# Patient Record
Sex: Female | Born: 1970 | Race: White | Smoking: Current every day smoker
Health system: Southern US, Community
[De-identification: ages and names within clinical notes are randomized; demographics above are authoritative.]

## PROBLEM LIST (undated history)

## (undated) DIAGNOSIS — J45909 Unspecified asthma, uncomplicated: Secondary | ICD-10-CM

## (undated) DIAGNOSIS — J449 Chronic obstructive pulmonary disease, unspecified: Secondary | ICD-10-CM

## (undated) DIAGNOSIS — K8020 Calculus of gallbladder without cholecystitis without obstruction: Secondary | ICD-10-CM

## (undated) DIAGNOSIS — D649 Anemia, unspecified: Secondary | ICD-10-CM

## (undated) HISTORY — DX: Chronic obstructive pulmonary disease, unspecified: J44.9

## (undated) HISTORY — DX: Anemia, unspecified: D64.9

## (undated) HISTORY — DX: Calculus of gallbladder without cholecystitis without obstruction: K80.20

## (undated) HISTORY — PX: TONSILLECTOMY: SUR1361

## (undated) HISTORY — DX: Unspecified asthma, uncomplicated: J45.909

---

## 2016-03-08 ENCOUNTER — Institutional Professional Consult (permissible substitution): Payer: Self-pay | Admitting: Internal Medicine

## 2016-03-23 ENCOUNTER — Ambulatory Visit (INDEPENDENT_AMBULATORY_CARE_PROVIDER_SITE_OTHER): Payer: 59 | Admitting: Internal Medicine

## 2016-03-23 ENCOUNTER — Other Ambulatory Visit: Payer: Managed Care, Other (non HMO)

## 2016-03-23 ENCOUNTER — Ambulatory Visit (INDEPENDENT_AMBULATORY_CARE_PROVIDER_SITE_OTHER)
Admission: RE | Admit: 2016-03-23 | Discharge: 2016-03-23 | Disposition: A | Discharge status: Home / Self Care | Payer: 59 | Source: Ambulatory Visit | Attending: Internal Medicine | Admitting: Internal Medicine

## 2016-03-23 ENCOUNTER — Encounter: Payer: Self-pay | Admitting: Internal Medicine

## 2016-03-23 VITALS — BP 142/86 | HR 100 | Ht 65.0 in | Wt 177.8 lb

## 2016-03-23 DIAGNOSIS — Z720 Tobacco use: Secondary | ICD-10-CM

## 2016-03-23 DIAGNOSIS — J449 Chronic obstructive pulmonary disease, unspecified: Secondary | ICD-10-CM

## 2016-03-23 DIAGNOSIS — F17210 Nicotine dependence, cigarettes, uncomplicated: Secondary | ICD-10-CM | POA: Insufficient documentation

## 2016-03-23 MED ORDER — BUDESONIDE-FORMOTEROL FUMARATE 160-4.5 MCG/ACT IN AERO: INHALATION_SPRAY | RESPIRATORY_TRACT | Status: DC

## 2016-03-23 NOTE — Assessment & Plan Note (Signed)

## 2016-03-23 NOTE — Progress Notes (Signed)
Subjective:    Patient ID: Shelly RivalMary Haynes, female    DOB: 1971/10/19,    MRN: [id number]  HPI   [age] yowf active smoker/ [facility] monitor [facility] asthma as child severe enough to require ER visits but out grew age 89 and  Fine including sports in HS but started smoking age 45 and around early 2007 dx as copd by [facility] in [place] rx advair which helped and then changed over to symbicort 80 and not doing as well so self referred to pulmonary clinic 03/23/2016    03/23/2016 1st  Pulmonary office visit/ Shelly Haynes   Chief Complaint  Patient presents with  . Pulmonary Consult    Self referral. Pt states she was dxed with COPD approx 10 yrs ago. She states she has also been dxed with allergies and asthma. She c/o when she walks briskly or runs.   feels tight in chest in am better p symbicort  MMRC1 = can walk nl pace, flat grade, can't hurry or go uphills or steps s sob   Worse with uri's / worse with exp dog or cat   No obvious other patterns in day to day or daytime variabilty or assoc excess/ purulent sputum or mucus plugs  or cp or chest tightness, subjective wheeze overt sinus or hb symptoms. No unusual exp hx or h/o childhood pna/ asthma or knowledge of premature birth.  Sleeping ok without nocturnal  or early am exacerbation  of respiratory  c/o's or need for noct saba. Also denies any obvious fluctuation of symptoms with weather or environmental changes or other aggravating or alleviating factors except as outlined above   Current Medications, Allergies, Complete Past Medical History, Past Surgical History, Family History, and Social History were reviewed in [facility] electronic medical record.                 Review of Systems  Constitutional: Negative for fever, chills and unexpected weight change.  HENT: Negative for congestion, dental problem, ear pain, nosebleeds, postnasal drip, rhinorrhea, sinus pressure, sneezing, sore throat, trouble swallowing and voice change.   Eyes:  Negative for visual disturbance.  Respiratory: Positive for shortness of breath. Negative for cough and choking.   Cardiovascular: Negative for chest pain and leg swelling.  Gastrointestinal: Negative for vomiting, abdominal pain and diarrhea.  Genitourinary: Negative for difficulty urinating.  Musculoskeletal: Negative for arthralgias.  Skin: Negative for rash.  Neurological: Negative for tremors, syncope and headaches.  Hematological: Does not bruise/bleed easily.       Objective:   Physical Exam  amb wf nad  Wt Readings from Last 3 Encounters:  03/23/16 177 lb 12.8 oz (80.65 kg)    Vital signs reviewed   HEENT: nl dentition, turbinates, and oropharynx. Nl external ear canals without cough reflex   NECK :  without JVD/Nodes/TM/ nl carotid upstrokes bilaterally   LUNGS: no acc muscle use,  Nl contour chest which is clear to A and P bilaterally without cough on insp or exp maneuvers   CV:  RRR  no s3 or murmur or increase in P2, no edema   ABD:  soft and nontender with nl inspiratory excursion in the supine position. No bruits or organomegaly, bowel sounds nl  MS:  Nl gait/ ext warm without deformities, calf tenderness, cyanosis or clubbing No obvious joint restrictions   SKIN: warm and dry without lesions    NEURO:  alert, approp, nl sensorium with  no motor deficits   CXR PA and Lateral:  03/23/2016 :    I personally reviewed images and agree with radiology impression as follows:   No active cardiopulmonary disease.       Labs ordered 03/23/2016 > alpha one phenotype, level     Assessment & Plan:

## 2016-03-23 NOTE — Patient Instructions (Addendum)
Plan A = Automatic = Symbicort 160 Take 2 puffs first thing in am and then another 2 puffs about 12 hours later.     Work on inhaler technique:  relax and gently blow all the way out then take a nice smooth deep breath back in, triggering the inhaler at same time you start breathing in.  Hold for up to 5 seconds if you can. Blow out thru nose. Rinse and gargle with water when done      Plan B = Backup Only use your albuterol as a rescue medication to be used if you can't catch your breath by resting or doing a relaxed purse lip breathing pattern.  - The less you use it, the better it will work when you need it. - Ok to use the inhaler up to 2 puffs  every 4 hours if you must but call for appointment if use goes up over your usual need - Don't leave home without it !!  (think of it like the spare tire for your car)   The key is to stop smoking completely before smoking completely stops you!    Please remember to go to the lab and x-ray department downstairs for your tests - we will call you with the results when they are available.  Please schedule a follow up office visit in 6 weeks, call sooner if needed with pfts same day - ok to push back         Plan D = Doctor - call me if B and C not adequate  Plan E = ER - go to ER or call 911 if all else fails

## 2016-03-24 NOTE — Assessment & Plan Note (Addendum)
Spirometry 03/23/2016  FEV1 2.09 (70%)  Ratio 67 p symb 80 x2 in am with 50% hfa      DDX of  difficult airways management almost all start with A and  include Adherence, Ace Inhibitors, Acid Reflux, Active Sinus Disease, Alpha 1 Antitripsin deficiency, Anxiety masquerading as Airways dz,  ABPA,  Allergy(esp in young), Aspiration (esp in elderly), Adverse effects of meds,  Active smokers, A bunch of PE's (a small clot burden can't cause this syndrome unless there is already severe underlying pulm or vascular dz with poor reserve) plus two Bs  = Bronchiectasis and Beta blocker use..and one C= CHF  Adherence is always the initial "prime suspect" and is a multilayered concern that requires a "trust but verify" approach in every patient - starting with knowing how to use medications, especially inhalers, correctly, keeping up with refills and understanding the fundamental difference between maintenance and prns vs those medications only taken for a very short course and then stopped and not refilled.  - The proper method of use, as well as anticipated side effects, of a metered-dose inhaler are discussed and demonstrated to the patient. Improved effectiveness after extensive coaching during this visit to a level of approximately 75 % from a baseline of 50 % > try symbicort 160 2bid pending return for full pfts  Active smoking also top of the list (see separate a/p)  Alpha one screening indicated > sent   ? Anxiety > dx of exclusion

## 2016-03-25 LAB — ALPHA-1-ANTITRYPSIN: A-1 Antitrypsin, Ser: 183 mg/dL (ref 83–199)

## 2016-04-01 LAB — ALPHA-1 ANTITRYPSIN PHENOTYPE: A-1 Antitrypsin: 191 mg/dL (ref 83–199)

## 2016-05-10 ENCOUNTER — Telehealth: Payer: Self-pay | Admitting: Internal Medicine

## 2016-05-10 NOTE — Telephone Encounter (Signed)
Spoke with patient-states the pharmacy called her as well; they were filling her insurance as well as the free for 1 year card given to her by our office. Once they did not file her insurance and only the free meds card it went through and no troubles at this time. Pt should not have any additional trouble getting filled for 1 year. Will close message.

## 2016-05-26 ENCOUNTER — Encounter: Payer: Self-pay | Admitting: Gastroenterology

## 2016-05-31 ENCOUNTER — Ambulatory Visit: Payer: Managed Care, Other (non HMO) | Admitting: Internal Medicine

## 2016-07-28 ENCOUNTER — Encounter (INDEPENDENT_AMBULATORY_CARE_PROVIDER_SITE_OTHER): Payer: Self-pay

## 2016-07-28 ENCOUNTER — Encounter: Payer: Self-pay | Admitting: Gastroenterology

## 2016-07-28 ENCOUNTER — Ambulatory Visit (INDEPENDENT_AMBULATORY_CARE_PROVIDER_SITE_OTHER): Payer: 59 | Admitting: Gastroenterology

## 2016-07-28 VITALS — BP 148/80 | HR 84 | Ht 65.0 in | Wt 179.0 lb

## 2016-07-28 DIAGNOSIS — K8050 Calculus of bile duct without cholangitis or cholecystitis without obstruction: Secondary | ICD-10-CM

## 2016-07-28 DIAGNOSIS — K8020 Calculus of gallbladder without cholecystitis without obstruction: Secondary | ICD-10-CM | POA: Diagnosis not present

## 2016-07-28 DIAGNOSIS — D649 Anemia, unspecified: Secondary | ICD-10-CM

## 2016-07-28 NOTE — Progress Notes (Signed)
HPI :  45 y/o female with a history of asthma / COPD, reported anemia, and gallstones, here for a new patient visit for abdominal pain / gallstones  She had an US done at [facility] in the ER. She presented there on 8/28 - pain in the RUQ. She reports she has been having some intermittent pains for a while, she thinks a year. When she eats she has some nausea, and usually develops some epigastric discomfort that radiates into her back. Fatty meals reliably precipitate her symptoms. She reports within 30-45 min of eating she will feel the pain, it will last an hour or so and then go away.   In addition to this she developed a new type of pain a few weeks ago which led to her ER visit - she thinks it was pain more so in the RUQ that radiated to her shoulders on the both sides, after she ate. She states her LFTs were normal (labs not available during clinic visit today) and RUQ US showed a positive Murphy sign with 2.6cm gallstone noted, with normal biliary tree / CBD.   She does endorse ibuprofen use, can take a few per day, a few days per week. She has taken nexium for the past 6 months for these symptoms, dosed at 40mg . Her pain has persisted during this time. No trouble swallowing or eating. No routine vomiting.   She reports she was told she had an anemia / low iron studies, which she thinks is new. She denies any blood in the stools.  No trouble with her bowels. No prior colonoscopy. No FH of colon cancer. She does endorse heavy menstrual cycles.   US done on 07/18/16 - 2.6cm gallstone noted, positive murphy sign, normal CBD and biliary tree. She wasa told she had normal liver enzymes.    Past Medical History:  Diagnosis Date  . Anemia   . Asthma   . COPD (chronic obstructive pulmonary disease) (HCC)   . Gallstones      Past Surgical History:  Procedure Laterality Date  . CESAREAN SECTION    . TONSILLECTOMY     Family History  Problem Relation Age of Onset  . Asthma  Sister   . Asthma Mother   . Asthma Maternal Grandmother   . Diabetes Father   . Heart disease Father     multiple family members on paternal side with heart disease as well  . Lung cancer Maternal Grandfather   . Lung cancer Maternal Uncle     x 2   Social History  Substance Use Topics  . Smoking status: Current Every Day Smoker    Packs/day: 1.50    Years: 26.00    Types: Cigarettes  . Smokeless tobacco: Never Used     Comment: form given 07/28/16  . Alcohol use No   Current Outpatient Prescriptions  Medication Sig Dispense Refill  . albuterol (VENTOLIN HFA) 108 (90 Base) MCG/ACT inhaler Inhale 2 puffs into the lungs every 6 (six) hours as needed for wheezing or shortness of breath.    Marland Kitchen amoxicillin (AMOXIL) 500 MG tablet Take 500 mg by mouth 2 (two) times daily.    . budesonide-formoterol (SYMBICORT) 160-4.5 MCG/ACT inhaler Take 2 puffs first thing in am and then another 2 puffs about 12 hours later. 1 Inhaler 11   No current facility-administered medications for this visit.    Allergies  Allergen Reactions  . Penicillins Rash     Review of Systems: All systems reviewed  and negative except where noted in HPI.    No results found. No labs available during today's visit  Physical Exam: BP (!) 148/80   Pulse 84   Ht 5\' 5"  (1.651 m)   Wt 179 lb (81.2 kg)   BMI 29.79 kg/m  Constitutional: Pleasant,well-developed, female in no acute distress. HEENT: Normocephalic and atraumatic. Conjunctivae are normal.  Neck supple.  Cardiovascular: Normal rate, regular rhythm.  Pulmonary/chest: Effort normal and breath sounds normal. No wheezing, rales or rhonchi. Abdominal: Soft, nondistended, nontender. Bowel sounds active throughout. There are no masses palpable. No hepatomegaly. Extremities: no edema Lymphadenopathy: No cervical adenopathy noted. Neurological: Alert and oriented to person place and time. Skin: Skin is warm and dry. No rashes noted. Psychiatric: Normal mood  and affect. Behavior is normal.   ASSESSMENT AND PLAN: 45 y/o female with intermittent postprandial pain for the past year, with a new severe type of RUQ pain leading to ER visit a few weeks ago. Symptoms appear to radiate to back / shoulder. LFTs reportedly normal (will obtain this result), US shows large gallstone without CBD dilation.   I suspect she likely has biliary colic / gallstones causing her symptoms. She has taken nexium for the past 6 months which has not helped her symptoms, I think PUD is less likely. I discussed gallstones / biliary colic with her, and potential risks of pancreatitis / biliary obstruction. Given her ongoing symptoms, will refer her to general surgery to discuss cholecystectomy.   I otherwise asked for her labs regarding her anemia. Based on history this sounds most likely related to menorrhagia, which she should have addressed first, as she denies any GI symptoms. She should follow up with her primary care regarding this issue.   Ileene PatrickSteven Orla Jolliff, MD [facility] Pager [phone]

## 2016-07-28 NOTE — Patient Instructions (Addendum)
You have been given a separate informational sheet regarding your tobacco use, the importance of quitting and local resources to help you quit.   I will call you with your surgical consult appointment as soon as I hear back from [facility].

## 2016-07-29 ENCOUNTER — Telehealth: Payer: Self-pay | Admitting: Gastroenterology

## 2016-07-29 ENCOUNTER — Other Ambulatory Visit: Payer: Self-pay | Admitting: Internal Medicine

## 2016-07-29 MED ORDER — ALBUTEROL SULFATE HFA 108 (90 BASE) MCG/ACT IN AERS: 2.0000 | INHALATION_SPRAY | Freq: Four times a day (QID) | RESPIRATORY_TRACT | 6 refills | Status: DC | PRN

## 2016-07-29 NOTE — Telephone Encounter (Signed)
Pt misplaced her Ventolin Rx that was given to her last OV.  Pt requesting that we call in Rx [facility] to [facility] [place].  This has been sent. Nothing further needed.

## 2016-08-01 NOTE — Telephone Encounter (Signed)
Left message for patient that her [facility] appointment is scheduled for 08/02/2016 with Dr. Gerrit FriendsGerkin at 9:45am, 9:15am arrival

## 2016-08-02 ENCOUNTER — Ambulatory Visit: Payer: Self-pay | Admitting: Surgery

## 2016-08-05 ENCOUNTER — Ambulatory Visit: Payer: Managed Care, Other (non HMO) | Admitting: Internal Medicine

## 2016-08-18 ENCOUNTER — Telehealth: Payer: Self-pay | Admitting: Internal Medicine

## 2016-08-18 NOTE — Telephone Encounter (Signed)
Per 03/23/16 OV: Plan A = Automatic = Symbicort 160 Take 2 puffs first thing in am and then another 2 puffs about 12 hours later.  Work on inhaler technique:  relax and gently blow all the way out then take a nice smooth deep breath back in, triggering the inhaler at same time you start breathing in.  Hold for up to 5 seconds if you can. Blow out thru nose. Rinse and gargle with water when done Plan B = Backup Only use your albuterol as a rescue medication to be used if you can't catch your breath by resting or doing a relaxed purse lip breathing pattern.  - The less you use it, the better it will work when you need it. - Ok to use the inhaler up to 2 puffs  every 4 hours if you must but call for appointment if use goes up over your usual need - Don't leave home without it !!  (think of it like the spare tire for your car)  The key is to stop smoking completely before smoking completely stops you!  Please remember to go to the lab and x-ray department downstairs for your tests - we will call you with the results when they are available. Please schedule a follow up office visit in 6 weeks, call sooner if needed with pfts same day - ok to push back -----  Pt has cancelled/NS for PFT visits/OV visits w/ [facility]. Jeanene Erballed spoke with Trey PaulaJeff MD pharmacists. Made aware pt has not been in the office. She will need OV to address this issue. nothing further needed

## 2017-04-04 ENCOUNTER — Other Ambulatory Visit: Payer: Self-pay | Admitting: Internal Medicine

## 2017-04-04 DIAGNOSIS — J449 Chronic obstructive pulmonary disease, unspecified: Secondary | ICD-10-CM

## 2017-11-28 IMAGING — DX DG CHEST 2V
2 series · 2 of 2 positions shown · non-contrast
Comparison: None.

CLINICAL DATA: Shortness of breath.  History of asthma.

EXAM:
CHEST  2 VIEW

[chest pa]
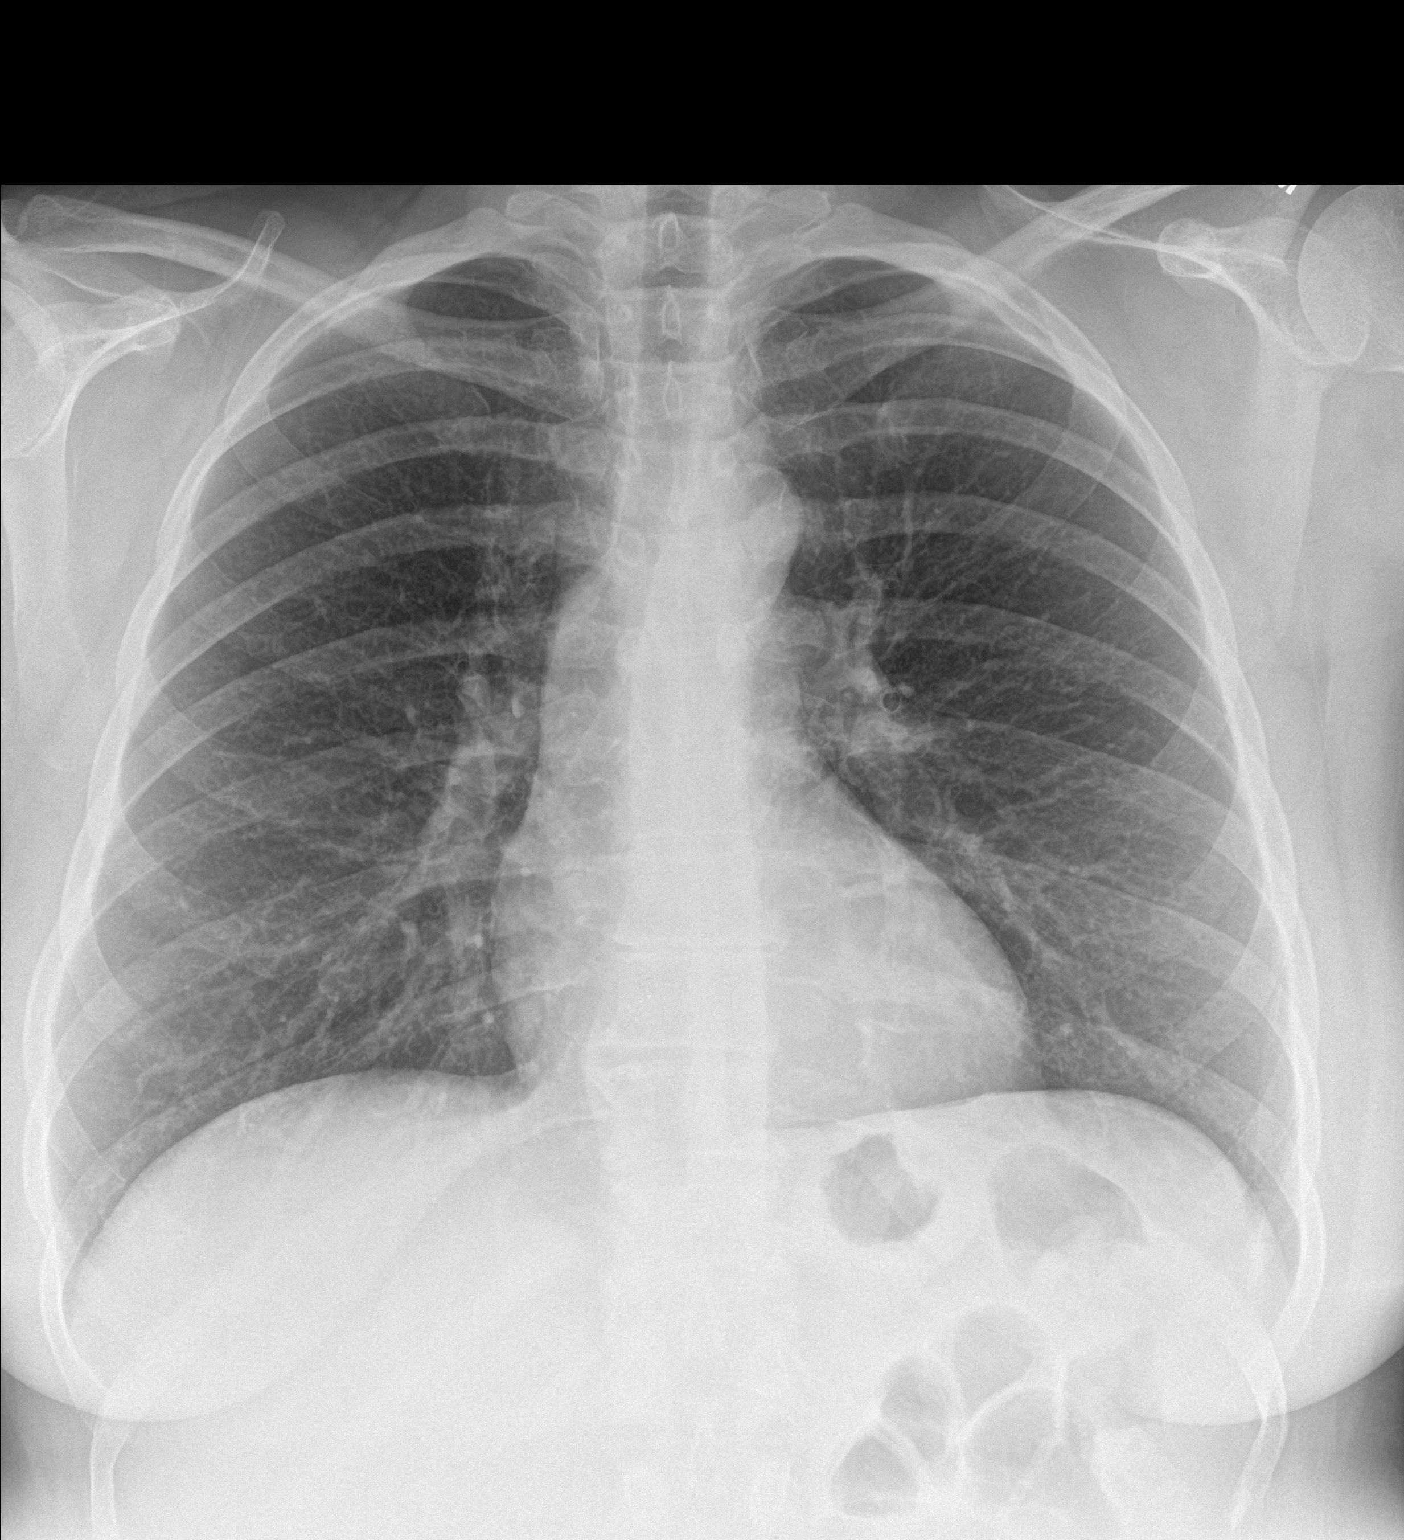

[chest lat]
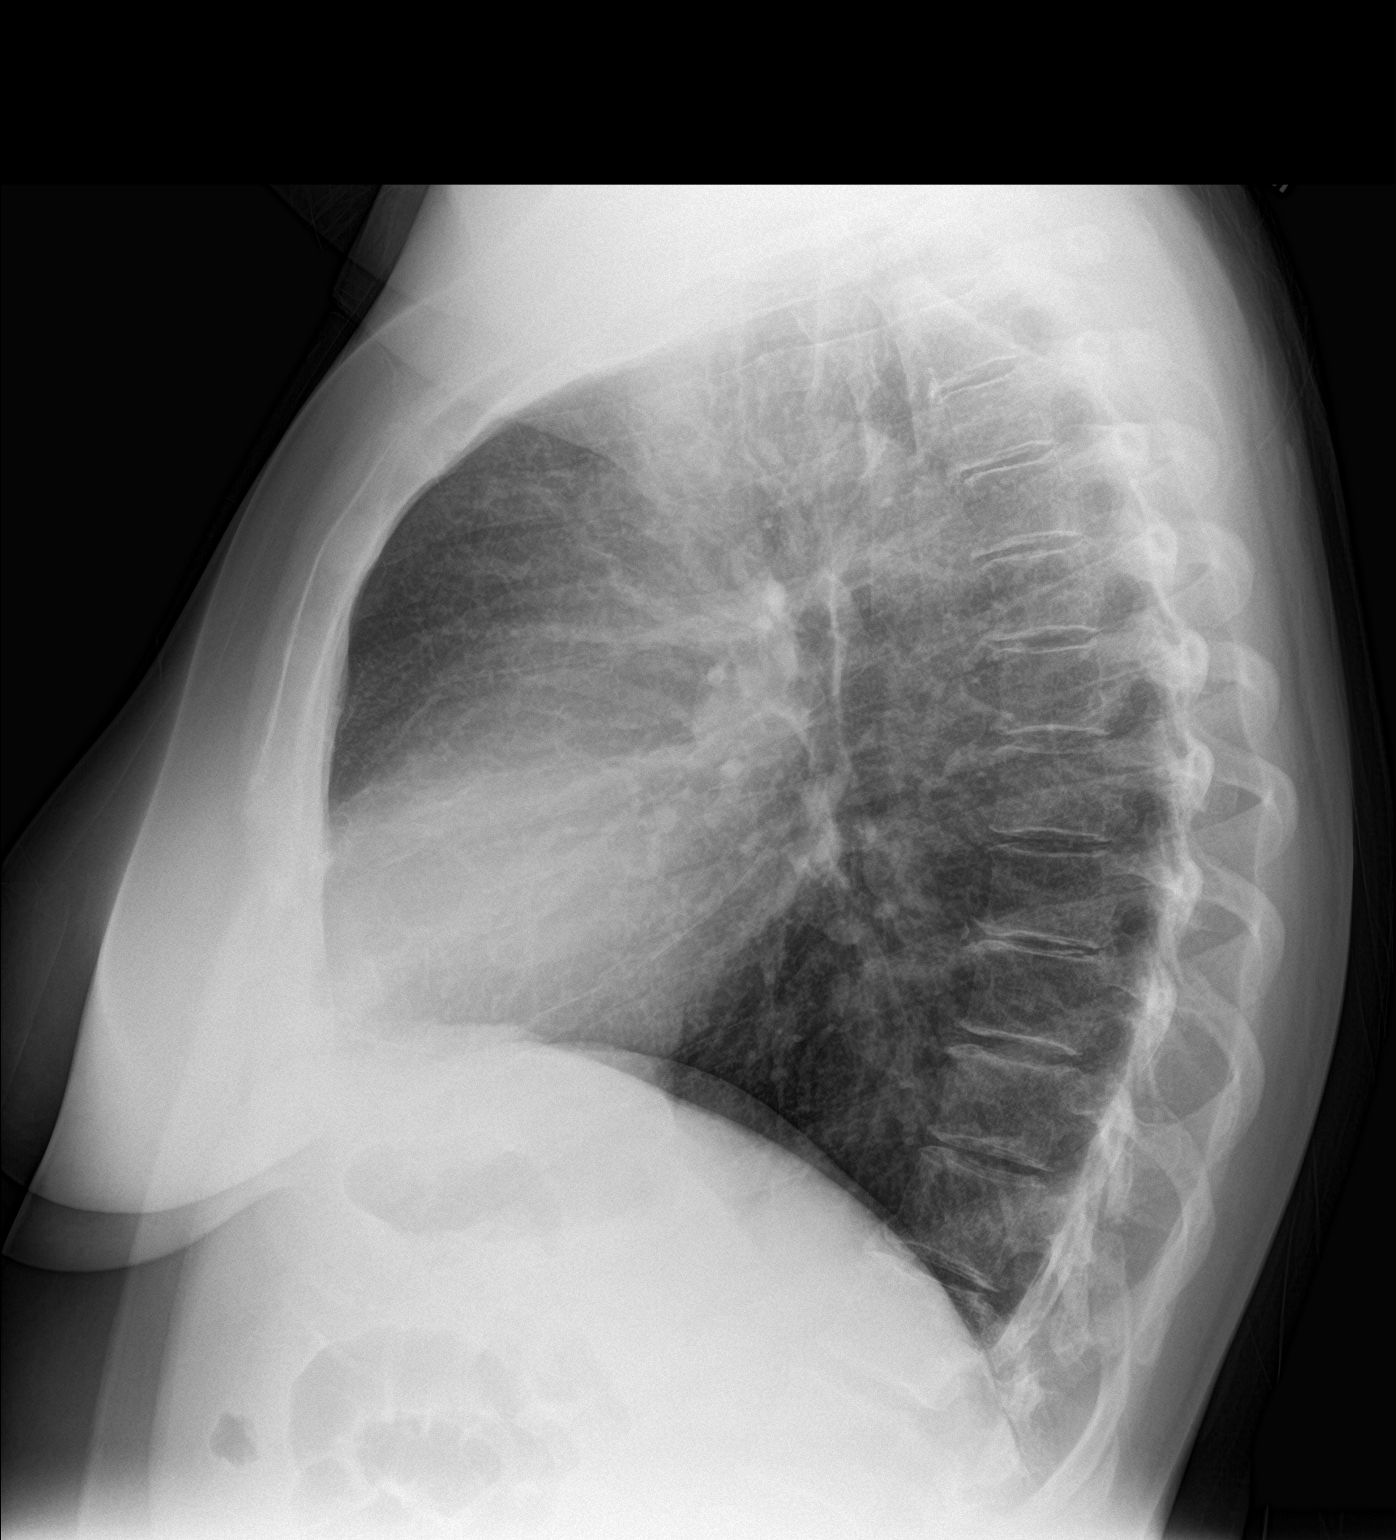

[2 of 2 positions shown; findings below may reference images not displayed]

FINDINGS: The heart size and mediastinal contours are within normal limits.
There is no focal infiltrate, pulmonary edema, or pleural effusion.
There is mild scoliosis of spine.
IMPRESSION: No active cardiopulmonary disease.

## 2018-02-21 ENCOUNTER — Telehealth: Payer: Self-pay | Admitting: Internal Medicine

## 2018-02-21 ENCOUNTER — Other Ambulatory Visit: Payer: Self-pay | Admitting: Internal Medicine

## 2018-02-21 DIAGNOSIS — J449 Chronic obstructive pulmonary disease, unspecified: Secondary | ICD-10-CM

## 2018-02-21 MED ORDER — BUDESONIDE-FORMOTEROL FUMARATE 160-4.5 MCG/ACT IN AERO: 2.0000 | INHALATION_SPRAY | Freq: Two times a day (BID) | RESPIRATORY_TRACT | 0 refills | Status: DC

## 2018-02-21 NOTE — Telephone Encounter (Signed)
1 inhaler sent to preferred pharmacy.  Pt aware.  Nothing further needed.

## 2018-02-21 NOTE — Telephone Encounter (Signed)
Called and spoke with patient, she was last seen in May of 2017. Patient has been scheduled to see you on 4.25.19 but she is out of her symbicort. Are you ok with sending a refill in for this patient. Please advise. Thanks.   Last OV note printed below. ------   Plan A = Automatic = Symbicort 160 Take 2 puffs first thing in am and then another 2 puffs about 12 hours later.     Work on inhaler technique:  relax and gently blow all the way out then take a nice smooth deep breath back in, triggering the inhaler at same time you start breathing in.  Hold for up to 5 seconds if you can. Blow out thru nose. Rinse and gargle with water when done      Plan B = Backup Only use your albuterol as a rescue medication to be used if you can't catch your breath by resting or doing a relaxed purse lip breathing pattern.  - The less you use it, the better it will work when you need it. - Ok to use the inhaler up to 2 puffs  every 4 hours if you must but call for appointment if use goes up over your usual need - Don't leave home without it !!  (think of it like the spare tire for your car)   The key is to stop smoking completely before smoking completely stops you!    Please remember to go to the lab and x-ray department downstairs for your tests - we will call you with the results when they are available.  Please schedule a follow up office visit in 6 weeks, call sooner if needed with pfts same day - ok to push back         Plan D = Doctor - call me if B and C not adequate  Plan E = ER - go to ER or call 911 if all else fails

## 2018-02-21 NOTE — Telephone Encounter (Signed)
Yes one refill is fine - be sure she return with all medications for all docs in hand since it's been so long that we've seen her.

## 2018-03-01 ENCOUNTER — Telehealth: Payer: Self-pay | Admitting: Internal Medicine

## 2018-03-01 NOTE — Telephone Encounter (Signed)
Pt has not been seen at our office since 03/23/16.  Called Pharm MD and spoke with Olegario MessierKathy letting her know that we only saw her that one visit and that she had referred herself to our office.  Olegario MessierKathy expressed understanding and stated to me she would call pt to figure out who pt's pcp is.  Nothing further needed at this time.

## 2018-03-15 ENCOUNTER — Ambulatory Visit: Payer: 59 | Admitting: Internal Medicine

## 2018-03-15 ENCOUNTER — Encounter: Payer: Self-pay | Admitting: Internal Medicine

## 2018-03-15 VITALS — BP 130/82 | HR 118 | Ht 64.0 in | Wt 173.0 lb

## 2018-03-15 DIAGNOSIS — I10 Essential (primary) hypertension: Secondary | ICD-10-CM | POA: Diagnosis not present

## 2018-03-15 DIAGNOSIS — J449 Chronic obstructive pulmonary disease, unspecified: Secondary | ICD-10-CM | POA: Diagnosis not present

## 2018-03-15 DIAGNOSIS — F17210 Nicotine dependence, cigarettes, uncomplicated: Secondary | ICD-10-CM | POA: Diagnosis not present

## 2018-03-15 MED ORDER — BUDESONIDE-FORMOTEROL FUMARATE 160-4.5 MCG/ACT IN AERO: INHALATION_SPRAY | RESPIRATORY_TRACT | 6 refills | Status: DC

## 2018-03-15 MED ORDER — ALBUTEROL SULFATE HFA 108 (90 BASE) MCG/ACT IN AERS: INHALATION_SPRAY | RESPIRATORY_TRACT | 1 refills | Status: DC

## 2018-03-15 NOTE — Patient Instructions (Signed)
Plan A = Automatic = symbiocort 160 Take 2 puffs first thing in am and then another 2 puffs about 12 hours later.     Plan B = Backup Only use your albuterol as a rescue medication to be used if you can't catch your breath by resting or doing a relaxed purse lip breathing pattern.  - The less you use it, the better it will work when you need it. - Ok to use the inhaler up to 2 puffs  every 4 hours if you must but call for appointment if use goes up over your usual need - Don't leave home without it !!  (think of it like the spare tire for your car)     The key is to stop smoking completely before smoking completely stops you!    Please schedule a follow up visit in 3 months but call sooner if needed with PFTs on return

## 2018-03-15 NOTE — Progress Notes (Signed)
Subjective:    Patient ID: Shelly Haynes, female    DOB: Apr 12, 1971,    MRN: [id number]     Brief patient profile:  46 yowf active smoker/ ekg monitor at [facility] asthma as child severe enough to require ER visits but out grew age 47 and  Fine including sports in HS but started smoking age 75 and around early 2007 dx as copd by [facility] in [place] rx advair which helped and then changed over to symbicort 80 and not doing as well so self referred to pulmonary clinic 03/23/2016     History of Present Illness  03/23/2016 1st [place] [facility] office visit/ Wert   Chief Complaint  Patient presents with  . Pulmonary Consult    Self referral. Pt states she was dxed with COPD approx 10 yrs ago. She states she has also been dxed with allergies and asthma. She c/o when she walks briskly or runs.   feels tight in chest in am better p symbicort  MMRC1 = can walk nl pace, flat grade, can't hurry or go uphills or steps s sob   Worse with uri's / worse with exp dog or cat  rec Plan A = Automatic = Symbicort 160 Take 2 puffs first thing in am and then another 2 puffs about 12 hours later.  Work on inhaler technique:   Plan B = Backup Only use your albuterol as a rescue medication  The key is to stop smoking completely before smoking completely stops you!    03/15/2018 acute extended ov/[facility] re:   Copd gold II / symb 160 2bid and still smoking  Chief Complaint  Patient presents with  . Acute Visit    She states having elevated BP and vision changes for the past 6 months- PCP told her that the symbicort may need to be changed. She states overall her breathing is doing well. She states that she has "allergies"- itchy eyes.   doe = MMRC1 = can walk nl pace, flat grade, can't hurry or go uphills or steps s sob   still smoking and using vapes bp never higher than 160/90 even when stressed  Sleeping fine flat Min am cough / mucoid sputum    No obvious day to day or daytime variability or assoc excess/  purulent sputum or mucus plugs or hemoptysis or cp or chest tightness, subjective wheeze or overt sinus or hb symptoms. No unusual exposure hx or h/o childhood pna/ asthma or knowledge of premature birth.  Sleeping  Fine flat  without nocturnal  or early am exacerbation  of respiratory  c/o's or need for noct saba. Also denies any obvious fluctuation of symptoms with weather or environmental changes or other aggravating or alleviating factors except as outlined above   Current Allergies, Complete Past Medical History, Past Surgical History, Family History, and Social History were reviewed in [employer] record.  ROS  The following are not active complaints unless bolded Hoarseness, sore throat, dysphagia, dental problems, itching, sneezing,  nasal congestion or discharge of excess mucus or purulent secretions, ear ache,   fever, chills, sweats, unintended wt loss or wt gain, classically pleuritic or exertional cp,  orthopnea pnd or arm/hand swelling  or leg swelling, presyncope, palpitations, abdominal pain, anorexia, nausea, vomiting, diarrhea  or change in bowel habits or change in bladder habits, change in stools or change in urine, dysuria, hematuria,  rash, arthralgias, visual complaints, headache, numbness, weakness or ataxia or problems with walking or coordination,  change in  mood or  memory.        Current Meds  Medication Sig  . budesonide-formoterol (SYMBICORT) 160-4.5 MCG/ACT inhaler Inhale 2 puffs into the lungs 2 (two) times daily.                    Objective:   Physical Exam  amb slt hoarse wf nad  Wt Readings from Last 3 Encounters:  03/15/18 173 lb (78.5 kg)  07/28/16 179 lb (81.2 kg)  03/23/16 177 lb 12.8 oz (80.6 kg)     Vital signs reviewed - Note on arrival 02 sats  99% on RA and BP 130/82      HEENT: nl dentition, turbinates bilaterally, and oropharynx. Nl external ear canals without cough reflex   NECK :  without JVD/Nodes/TM/ nl  carotid upstrokes bilaterally   LUNGS: no acc muscle use,  Nl contour chest which is clear to A and P bilaterally without cough on insp or exp maneuvers   CV:  RRR  no s3 or murmur or increase in P2, and no edema   ABD:  soft and nontender with nl inspiratory excursion in the supine position. No bruits or organomegaly appreciated, bowel sounds nl  MS:  Nl gait/ ext warm without deformities, calf tenderness, cyanosis or clubbing No obvious joint restrictions   SKIN: warm and dry without lesions    NEURO:  alert, approp, nl sensorium with  no motor or cerebellar deficits apparent.                Assessment & Plan:

## 2018-03-19 ENCOUNTER — Encounter: Payer: Self-pay | Admitting: Internal Medicine

## 2018-03-19 DIAGNOSIS — I10 Essential (primary) hypertension: Secondary | ICD-10-CM | POA: Insufficient documentation

## 2018-03-19 NOTE — Assessment & Plan Note (Signed)

## 2018-03-19 NOTE — Assessment & Plan Note (Signed)
Spirometry 03/23/2016  FEV1 2.09 (70%)  Ratio 67 p symb 80 x2 in am with 50% hfa > try symbicort 160 2bid   - alpha one AT screen 03/23/16 > MM/ 191   considering she's actively smoking she's doing ok and could be considered for lama or lama /laba depending on what the viz problem is but she's been doing so well on the symbicort 160 and it is the only strength approved for copd I'm reluctant to change it for now.  rec No change rx Consult opth for viz issues and contact this office if any concern re symbicort  Work on stop smoking (see separate a/p)  Return in 3 months for full pfts   I had an extended discussion with the patient reviewing all relevant studies completed to date and  lasting 15 to 20 minutes of a 25 minute visit    Each maintenance medication was reviewed in detail including most importantly the difference between maintenance and prns and under what circumstances the prns are to be triggered using an action plan format that is not reflected in the computer generated alphabetically organized AVS.    Please see AVS for specific instructions unique to this visit that I personally wrote and verbalized to the the pt in detail and then reviewed with pt  by my nurse highlighting any  changes in therapy recommended at today's visit to their plan of care.

## 2018-03-19 NOTE — Assessment & Plan Note (Signed)
bp ok today off all rx but she tells me they have been borderline high since last ov and fm hx pos for hbp in multiple members   rec Avoid salt/ stop smoking/ will re -eval for possible d/c ics on f/u ov but doubt it's the culprit here and if she advised  doesn't stop smoking she will likely develop hbp anyway regardless of choice of inhalers.

## 2018-09-14 ENCOUNTER — Ambulatory Visit: Payer: 59 | Admitting: Internal Medicine

## 2018-09-17 ENCOUNTER — Ambulatory Visit: Payer: 59 | Admitting: Internal Medicine

## 2018-10-04 ENCOUNTER — Other Ambulatory Visit: Payer: Self-pay | Admitting: Internal Medicine

## 2018-10-04 ENCOUNTER — Telehealth: Payer: Self-pay | Admitting: Internal Medicine

## 2018-10-04 DIAGNOSIS — J449 Chronic obstructive pulmonary disease, unspecified: Secondary | ICD-10-CM

## 2018-10-04 MED ORDER — BUDESONIDE-FORMOTEROL FUMARATE 160-4.5 MCG/ACT IN AERO: 2.0000 | INHALATION_SPRAY | Freq: Two times a day (BID) | RESPIRATORY_TRACT | 0 refills | Status: DC

## 2018-10-04 NOTE — Telephone Encounter (Signed)
Patient states she is out of Symbicort and needs this sent over today if possible.

## 2018-10-04 NOTE — Telephone Encounter (Signed)
Called and spoke with pt who stated she was needing a new Rx of Symbicort due to being completely out. I stated to pt we needed to get her scheduled for an OV due to her having several cancellations as well as some No Shows.  Pt expressed understanding and has scheduled an appt with MW 10/29/18 with PFT prior. I stated to pt I would send over enough of the symbicort to get her through until her appt and told her if she kept her appt we could take care of sending more refills for her at that time. Pt expressed understanding. Nothing further needed.

## 2018-10-29 ENCOUNTER — Ambulatory Visit: Payer: 59 | Admitting: Internal Medicine

## 2018-10-30 ENCOUNTER — Ambulatory Visit (INDEPENDENT_AMBULATORY_CARE_PROVIDER_SITE_OTHER): Payer: 59 | Admitting: Internal Medicine

## 2018-10-30 DIAGNOSIS — J449 Chronic obstructive pulmonary disease, unspecified: Secondary | ICD-10-CM | POA: Diagnosis not present

## 2018-10-30 NOTE — Progress Notes (Signed)
PFT done today. 

## 2018-11-02 LAB — PULMONARY FUNCTION TEST
DL/VA % pred: 65 %
DL/VA: 3.05 ml/min/mmHg/L
DLCO UNC: 16.19 ml/min/mmHg
DLCO unc % pred: 70 %
FEF 25-75 POST: 1.44 L/s
FEF 25-75 Pre: 1.11 L/sec
FEF2575-%Change-Post: 29 %
FEF2575-%PRED-PRE: 38 %
FEF2575-%Pred-Post: 50 %
FEV1-%Change-Post: 9 %
FEV1-%PRED-POST: 79 %
FEV1-%PRED-PRE: 73 %
FEV1-PRE: 2.05 L
FEV1-Post: 2.24 L
FEV1FVC-%Change-Post: 1 %
FEV1FVC-%PRED-PRE: 79 %
FEV6-%CHANGE-POST: 7 %
FEV6-%PRED-POST: 99 %
FEV6-%PRED-PRE: 92 %
FEV6-POST: 3.38 L
FEV6-Pre: 3.15 L
FEV6FVC-%CHANGE-POST: 0 %
FEV6FVC-%PRED-POST: 100 %
FEV6FVC-%Pred-Pre: 100 %
FVC-%Change-Post: 6 %
FVC-%PRED-PRE: 91 %
FVC-%Pred-Post: 98 %
FVC-POST: 3.42 L
FVC-PRE: 3.2 L
POST FEV6/FVC RATIO: 99 %
PRE FEV1/FVC RATIO: 64 %
Post FEV1/FVC ratio: 65 %
Pre FEV6/FVC Ratio: 99 %
RV % pred: 161 %
RV: 2.68 L
TLC % PRED: 122 %
TLC: 6 L

## 2018-11-05 ENCOUNTER — Encounter: Payer: Self-pay | Admitting: Internal Medicine

## 2018-11-05 ENCOUNTER — Ambulatory Visit (INDEPENDENT_AMBULATORY_CARE_PROVIDER_SITE_OTHER): Payer: 59 | Admitting: Internal Medicine

## 2018-11-05 VITALS — BP 150/88 | HR 109 | Ht 63.0 in | Wt 171.8 lb

## 2018-11-05 DIAGNOSIS — F17210 Nicotine dependence, cigarettes, uncomplicated: Secondary | ICD-10-CM | POA: Diagnosis not present

## 2018-11-05 DIAGNOSIS — J449 Chronic obstructive pulmonary disease, unspecified: Secondary | ICD-10-CM | POA: Diagnosis not present

## 2018-11-05 MED ORDER — BUDESONIDE-FORMOTEROL FUMARATE 160-4.5 MCG/ACT IN AERO: 2.0000 | INHALATION_SPRAY | Freq: Two times a day (BID) | RESPIRATORY_TRACT | 11 refills | Status: DC

## 2018-11-05 MED ORDER — BUPROPION HCL ER (SR) 150 MG PO TB12: ORAL_TABLET | ORAL | 2 refills | Status: DC

## 2018-11-05 NOTE — Patient Instructions (Signed)
No change on respiratory medications  Try wellbutrin 150 mg one half daily x 2 weeks, then one daily x 2 weeks, then one twice daily    If not doing well with wellbutrin see your primary doctor  For ? chantix    Please schedule a follow up visit in 6 months but call sooner if needed

## 2018-11-05 NOTE — Progress Notes (Signed)
Subjective:    Patient ID: Shelly Haynes, female    DOB: 06/28/1971,    MRN: [id number]     Brief patient profile:  47 yowf active smoker/ ekg monitor at [facility] asthma as child severe enough to require ER visits but out grew age 47 and  Fine including sports in HS but started smoking age 8 and around early 2007 dx as copd by [facility] in [place] rx advair which helped and then changed over to symbicort 80 and not doing as well so self referred to pulmonary clinic 03/23/2016     History of Present Illness  03/23/2016 1st [facility] office visit/ Ailany Koren   Chief Complaint  Patient presents with  . Pulmonary Consult    Self referral. Pt states she was dxed with COPD approx 10 yrs ago. She states she has also been dxed with allergies and asthma. She c/o when she walks briskly or runs.   feels tight in chest in am better p symbicort  MMRC1 = can walk nl pace, flat grade, can't hurry or go uphills or steps s sob   Worse with uri's / worse with exp dog or cat  rec Plan A = Automatic = Symbicort 160 Take 2 puffs first thing in am and then another 2 puffs about 12 hours later.  Work on inhaler technique:   Plan B = Backup Only use your albuterol as a rescue medication  The key is to stop smoking completely before smoking completely stops you!    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 Acute Visit    She states having elevated BP and vision changes for the past 6 months- PCP told her that the symbicort may need to be changed. She states overall her breathing is doing well. She states that she has "allergies"- itchy eyes.   doe = MMRC1 = can walk nl pace, flat grade, can't hurry or go uphills or steps s sob   still smoking and using vapes bp never higher than 160/90 even when stressed  Sleeping fine flat Min am cough / mucoid      11/05/2018  f/u ov/Dominico Rod re: GOLD I/II  maint on symbicort 160  Chief  Complaint  Patient presents with  . Follow-up    PFT's done 10/30/18. She c/o anxiety recently that she relates to her breathing. She states occ has some burning in her throat. She rarely uses her albuterol inhaler.   Dyspnea:  MMRC1 = can walk nl pace, flat grade, can't hurry or go uphills or steps s sob   Cough: minimal /triggered by candles, cats, spring time weather  Sleeping: on side bed flat/ 2 pillows SABA use: rarely while on symb 160 2bid  02: none   Some hb/ throat burning controlled with otcs  No obvious day to day or daytime variability or assoc excess/ purulent sputum or mucus plugs or hemoptysis or cp or chest tightness, subjective wheeze or overt sinus  symptoms.   Sleeping as above without nocturnal  or early am exacerbation  of respiratory  c/o's or need for noct saba. Also denies any obvious fluctuation of symptoms with weather or environmental changes or other aggravating or alleviating factors except as outlined above   No unusual exposure hx or h/o childhood pna/ asthma or knowledge of premature birth.  Current Allergies, Complete Past Medical History, Past Surgical History, Family History, and Social  History were reviewed in  [facility] electronic medical record.  ROS  The following are not active complaints unless bolded Hoarseness, sore throat, dysphagia, dental problems, itching, sneezing,  nasal congestion or discharge of excess mucus or purulent secretions, ear ache,   fever, chills, sweats, unintended wt loss or wt gain, classically pleuritic or exertional cp,  orthopnea pnd or arm/hand swelling  or leg swelling, presyncope, palpitations, abdominal pain, anorexia, nausea, vomiting, diarrhea  or change in bowel habits or change in bladder habits, change in stools or change in urine, dysuria, hematuria,  rash, arthralgias, visual complaints, headache, numbness, weakness or ataxia or problems with walking or coordination,  change in mood= anxious or  memory.         Current Meds  Medication Sig  . albuterol (PROAIR HFA) 108 (90 Base) MCG/ACT inhaler 2 puffs every 4 hours as needed only  if your can't catch your breath  . budesonide-formoterol (SYMBICORT) 160-4.5 MCG/ACT inhaler Inhale 2 puffs into the lungs 2 (two) times daily. Take 2 puffs first thing in am and then another 2 puffs about 12 hours later.  .                  Objective:   Physical Exam   amb wf nad    11/05/2018     171   03/15/18 173 lb (78.5 kg)  07/28/16 179 lb (81.2 kg)  03/23/16 177 lb 12.8 oz (80.6 kg)      Vital signs reviewed - Note on arrival 02 sats  99% on RA and bp 150/88         HEENT: nl dentition, turbinates bilaterally, and oropharynx. Nl external ear canals without cough reflex   NECK :  without JVD/Nodes/TM/ nl carotid upstrokes bilaterally   LUNGS: no acc muscle use,  Nl contour chest which is clear to A and P bilaterally without cough on insp or exp maneuvers   CV:  RRR  no s3 or murmur or increase in P2, and no edema   ABD:  Mildly obese but soft and nontender with nl inspiratory excursion in the supine position. No bruits or organomegaly appreciated, bowel sounds nl  MS:  Nl gait/ ext warm without deformities, calf tenderness, cyanosis or clubbing No obvious joint restrictions   SKIN: warm and dry without lesions    NEURO:  alert, approp, nl sensorium with  no motor or cerebellar deficits apparent.          Assessment & Plan:

## 2018-11-06 ENCOUNTER — Encounter: Payer: Self-pay | Admitting: Internal Medicine

## 2018-11-06 NOTE — Assessment & Plan Note (Signed)
Spirometry 03/23/2016  FEV1 2.09 (70%)  Ratio 67 p symb 80 x2 in am with 50% hfa > try symbicort 160 2bid   - alpha one AT screen 03/23/16 > MM/ 191    - PFT's  11/05/2018  FEV1 2.24  (79 % ) ratio 65  p 9 % improvement from saba p symb  prior to study with DLCO  70 % corrects to 65  % for alv volume   - 11/05/2018  After extensive coaching inhaler device,  effectiveness =    90%  She barely meets criteria for GOLD II and despite active smoking has good control of sob / wheeze with sym 160 2 bid so no change in rx needed for now

## 2018-11-06 NOTE — Assessment & Plan Note (Signed)
4-5 min discussion re active cigarette smoking in addition to office E&M  Ask about tobacco use:   Ongoing  Advise quitting   I took an extended  opportunity with this patient to outline the consequences of continued cigarette use  in airway disorders based on all the data we have from the multiple national lung health studies (perfomed over decades at millions of dollars in cost)  indicating that smoking cessation, not choice of inhalers or physicians, is the most important aspect of care.   Assess willingness:  Considering but needs help with "nerves" Assist in quit attempt:  Try wellbutrin titrate up to 150 bid if tol  Arrange follow up:   Follow up per Primary Care planned for chantix trial if wellbutrin not helpful for anxiety or smoking cessation       I had an extended discussion with the patient reviewing all relevant studies completed to date and  lasting 15 to 20 minutes of a 25 minute visit    See device teaching which extended face to face time for this visit.  Each maintenance medication was reviewed in detail including emphasizing most importantly the difference between maintenance and prns and under what circumstances the prns are to be triggered using an action plan format that is not reflected in the computer generated alphabetically organized AVS which I have not found useful in most complex patients, especially with respiratory illnesses  Please see AVS for specific instructions unique to this visit that I personally wrote and verbalized to the the pt in detail and then reviewed with pt  by my nurse highlighting any  changes in therapy recommended at today's visit to their plan of care.

## 2018-11-28 ENCOUNTER — Other Ambulatory Visit: Payer: Self-pay | Admitting: Internal Medicine

## 2018-11-28 DIAGNOSIS — F17210 Nicotine dependence, cigarettes, uncomplicated: Secondary | ICD-10-CM

## 2018-11-28 MED ORDER — BUPROPION HCL ER (SR) 150 MG PO TB12: ORAL_TABLET | ORAL | 2 refills | Status: DC

## 2019-01-25 ENCOUNTER — Telehealth: Payer: Self-pay | Admitting: Internal Medicine

## 2019-01-25 NOTE — Telephone Encounter (Signed)
LVMTCB x 1 for patient. Need more detail regarding what reaction she is having to the Wellbutrin, when it started and severity of it.

## 2019-01-28 NOTE — Telephone Encounter (Signed)
ATC pt, no answer. Left message for pt to call back.  

## 2019-01-29 NOTE — Telephone Encounter (Signed)
Called patient, unable to reach left message to give us a call back. Per protocol will close encounter.  

## 2019-02-07 ENCOUNTER — Telehealth: Payer: Self-pay | Admitting: Internal Medicine

## 2019-02-07 NOTE — Telephone Encounter (Signed)
Called and spoke with patient she stated that she would like to switch her medication from welbutrin over to hydroxyine.   MW please advise, thank you.

## 2019-02-08 MED ORDER — HYDROXYZINE HCL 50 MG PO TABS: 50.0000 mg | ORAL_TABLET | Freq: Every day | ORAL | 5 refills | Status: DC

## 2019-02-08 NOTE — Telephone Encounter (Signed)
Called and spoke with patient advised her with response from [facility]. Patient verbalized understanding and had no further questions or concerns. Patient stated that she would like to start the hydroxyzine with the Welbutrin.   MW please advise, thank you.

## 2019-02-08 NOTE — Telephone Encounter (Signed)
The two are not interchangeable What some pts do is add vistaril (hydroxyzine) 50 mg at hs if having trouble sleeping on wellbutrin but this has nothing to do with treating nicotine withdrawal.   If not happy with wellbutrin effects and does not want to add the vistaril to it then no need to take vistaril on its own and would just step the wellbutrin.  I did this as a favor to her since no pcp but this is why I again strongly rec she get a PCP.

## 2019-02-08 NOTE — Telephone Encounter (Signed)
Vistaril 50 mg  #30  rx one qhs refill x 5

## 2019-02-08 NOTE — Telephone Encounter (Signed)
Spoke with the pt and notified of recs per Dr Sherene Sires  She verbalized understanding  Nothing further needed  Rx was sent

## 2019-03-03 ENCOUNTER — Other Ambulatory Visit: Payer: Self-pay | Admitting: Internal Medicine

## 2019-05-07 ENCOUNTER — Other Ambulatory Visit: Payer: Self-pay

## 2019-05-07 ENCOUNTER — Encounter: Payer: Self-pay | Admitting: Internal Medicine

## 2019-05-07 ENCOUNTER — Ambulatory Visit (INDEPENDENT_AMBULATORY_CARE_PROVIDER_SITE_OTHER): Payer: 59 | Admitting: Internal Medicine

## 2019-05-07 DIAGNOSIS — J449 Chronic obstructive pulmonary disease, unspecified: Secondary | ICD-10-CM | POA: Diagnosis not present

## 2019-05-07 DIAGNOSIS — F17210 Nicotine dependence, cigarettes, uncomplicated: Secondary | ICD-10-CM

## 2019-05-07 MED ORDER — OMEPRAZOLE MAGNESIUM 20 MG PO TBEC: 20.0000 mg | DELAYED_RELEASE_TABLET | Freq: Every day | ORAL | Status: AC

## 2019-05-07 NOTE — Patient Instructions (Signed)
Try again:  Try wellbutrin 150 mg one half daily x 2 weeks, then one daily x 2 weeks, then one twice daily   If side effects develop that don't respond to vistaril then drop back to the previous dose   Keep working on stopping smoking  Ok to continue prilosec 20 mg otc Take 30-60 min before first meal of the day    Please schedule a follow up visit in 3 months but call sooner if needed   (needs my chart set up)

## 2019-05-07 NOTE — Assessment & Plan Note (Signed)
Did not tolerate wellbutrin in high doses so rec rechallenge with vistaril prn    Each maintenance medication was reviewed in detail including most importantly the difference between maintenance and as needed and under what circumstances the prns are to be used.  Please see AVS for specific  Instructions which are unique to this visit and I personally typed out  which were reviewed in detail over the phonewith the patient and a copy provided.

## 2019-05-07 NOTE — Assessment & Plan Note (Signed)
Active smoker Spirometry 03/23/2016  FEV1 2.09 (70%)  Ratio 67 p symb 80 x2 in am with 50% hfa > try symbicort 160 2bid   - alpha one AT screen 03/23/16 > MM/ 191   - PFT's  11/05/2018  FEV1 2.24  (79 % ) ratio 65  p 9 % improvement from saba p symb  prior to study with DLCO  70 % corrects to 65  % for alv volume   - 11/05/2018  After extensive coaching inhaler device,  effectiveness =    90%  Adequate control on present rx, reviewed in detail with pt > no change in rx needed

## 2019-05-07 NOTE — Progress Notes (Signed)
Subjective:    Patient ID: Shelly Haynes, female    DOB: [birth year],    MRN: [id number]     Brief patient profile:  73 yowf active smoker/ ekg monitor at [facility] asthma as child severe enough to require ER visits but out grew age 48 and  Fine including sports in HS but started smoking age 86 and around early 2007 dx as copd by [facility] in [place] rx advair which helped and then changed over to symbicort 93 and not doing as well so self referred to pulmonary clinic 03/23/2016     History of Present Illness  03/23/2016 1st [facility] office visit/ Ezri Landers   Chief Complaint  Patient presents with  . Pulmonary Consult    Self referral. Pt states she was dxed with COPD approx 10 yrs ago. She states she has also been dxed with allergies and asthma. She c/o when she walks briskly or runs.   feels tight in chest in am better p symbicort  MMRC1 = can walk nl pace, flat grade, can't hurry or go uphills or steps s sob   Worse with uri's / worse with exp dog or cat  rec Plan A = Automatic = Symbicort 160 Take 2 puffs first thing in am and then another 2 puffs about 12 hours later.  Work on inhaler technique:   Plan B = Backup Only use your albuterol as a rescue medication  The key is to stop smoking completely before smoking completely stops you!    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 Acute Visit    She states having elevated BP and vision changes for the past 6 months- PCP told her that the symbicort may need to be changed. She states overall her breathing is doing well. She states that she has "allergies"- itchy eyes.   doe = MMRC1 = can walk nl pace, flat grade, can't hurry or go uphills or steps s sob   still smoking and using vapes bp never higher than 160/90 even when stressed  Sleeping fine flat Min am cough / mucoid      11/05/2018  f/u ov/Corday Wyka re: GOLD I/II  maint on symbicort 160  Chief  Complaint  Patient presents with  . Follow-up    PFT's done 10/30/18. She c/o anxiety recently that she relates to her breathing. She states occ has some burning in her throat. She rarely uses her albuterol inhaler.   Dyspnea:  MMRC1 = can walk nl pace, flat grade, can't hurry or go uphills or steps s sob   Cough: minimal /triggered by candles, cats, spring time weather  Sleeping: on side bed flat/ 2 pillows SABA use: rarely while on symb 160 2bid  02: none   Some hb/ throat burning controlled with otcs rec No change on respiratory medications Try wellbutrin 150 mg one half daily x 2 weeks, then one daily x 2 weeks, then one twice daily  If not doing well with wellbutrin see your primary doctor  For ? chantix    Virtual Visit via Telephone Note 05/07/2019  GOLD I/II on symb 160   I connected with Shelly Haynes on 05/07/19 at  1:30  PM EDT  by telephone and verified that I am speaking with the correct person using two identifiers.   I discussed the limitations, risks, security and privacy concerns of performing an evaluation and management service  by telephone and the availability of in person appointments. I also discussed with the patient that there may be a patient responsible charge related to this service. The patient expressed understanding and agreed to proceed.   History of Present Illness: Dyspnea:  Still MMRC =1  Cough: minimal in am / better p symbicort  Sleeping: better p vistaril  SABA use: 3 x weekly  02: none Throat burning resolved with prilosec 20 mg otc ac qd   No obvious day to day or daytime variability or assoc excess/ purulent sputum or mucus plugs or hemoptysis or cp or chest tightness, subjective wheeze or overt sinus or hb symptoms.    Also denies any obvious fluctuation of symptoms with weather or environmental changes or other aggravating or alleviating factors except as outlined above.   Meds reviewed/ med reconciliation completed           Observations/Objective: Sounds good on the phone, min raspy voice full sentences    Assessment and Plan: See problem list for active a/p's   Follow Up Instructions: See avs for instructions unique to this ov which includes revised/ updated med list     I discussed the assessment and treatment plan with the patient. The patient was provided an opportunity to ask questions and all were answered. The patient agreed with the plan and demonstrated an understanding of the instructions.   The patient was advised to call back or seek an in-person evaluation if the symptoms worsen or if the condition fails to improve as anticipated.  I provided 15  minutes of non-face-to-face time during this encounter.   Sandrea HughsMichael Valentino Saavedra, MD

## 2019-08-08 ENCOUNTER — Ambulatory Visit (INDEPENDENT_AMBULATORY_CARE_PROVIDER_SITE_OTHER): Payer: 59 | Admitting: Internal Medicine

## 2019-08-08 ENCOUNTER — Encounter: Payer: Self-pay | Admitting: Internal Medicine

## 2019-08-08 ENCOUNTER — Other Ambulatory Visit: Payer: Self-pay

## 2019-08-08 DIAGNOSIS — J449 Chronic obstructive pulmonary disease, unspecified: Secondary | ICD-10-CM

## 2019-08-08 DIAGNOSIS — F17210 Nicotine dependence, cigarettes, uncomplicated: Secondary | ICD-10-CM

## 2019-08-08 NOTE — Patient Instructions (Addendum)
The key is to stop smoking completely before smoking completely stops you!    Plan A = Automatic = Always=    symbicort 160 Take 2 puffs first thing in am and then another 2 puffs about 12 hours later.    Plan B = Backup (to supplement plan A, not to replace it) Only use your albuterol inhaler as a rescue medication to be used if you can't catch your breath by resting or doing a relaxed purse lip breathing pattern.  - The less you use it, the better it will work when you need it. - Ok to use the inhaler up to 2 puffs  every 4 hours if you must but call for appointment if use goes up over your usual need - Don't leave home without it !!  (think of it like the spare tire for your car)     Please schedule a follow up visit in 3 months but call sooner if needed  with all medications /inhalers/ solutions in hand so we can verify exactly what you are taking. This includes all medications from all doctors and over the counters

## 2019-08-08 NOTE — Progress Notes (Signed)
Subjective:    Patient ID: Shelly Haynes, female    DOB: 09/20/1971,    MRN: [id number]     Brief patient profile:  48 yowf active smoker/ ekg monitor at [facility] asthma as child severe enough to require ER visits but out grew age 48 and  Fine including sports in HS but started smoking age 26 and around early 2007 dx as copd by [facility] in [place] rx advair which helped and then changed over to symbicort 80 and not doing as well so self referred to pulmonary clinic 03/23/2016     History of Present Illness  03/23/2016 1st [facility] office visit/ Tisha Cline   Chief Complaint  Patient presents with  . Pulmonary Consult    Self referral. Pt states she was dxed with COPD approx 10 yrs ago. She states she has also been dxed with allergies and asthma. She c/o when she walks briskly or runs.   feels tight in chest in am better p symbicort  MMRC1 = can walk nl pace, flat grade, can't hurry or go uphills or steps s sob   Worse with uri's / worse with exp dog or cat  rec Plan A = Automatic = Symbicort 160 Take 2 puffs first thing in am and then another 2 puffs about 12 hours later.  Work on inhaler technique:   Plan B = Backup Only use your albuterol as a rescue medication  The key is to stop smoking completely before smoking completely stops you!    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 Acute Visit    She states having elevated BP and vision changes for the past 6 months- PCP told her that the symbicort may need to be changed. She states overall her breathing is doing well. She states that she has "allergies"- itchy eyes.   doe = MMRC1 = can walk nl pace, flat grade, can't hurry or go uphills or steps s sob   still smoking and using vapes bp never higher than 160/90 even when stressed  Sleeping fine flat Min am cough / mucoid      11/05/2018  f/u ov/Barbarann Kelly re: GOLD I/II  maint on symbicort 160  Chief  Complaint  Patient presents with  . Follow-up    PFT's done 10/30/18. She c/o anxiety recently that she relates to her breathing. She states occ has some burning in her throat. She rarely uses her albuterol inhaler.   Dyspnea:  MMRC1 = can walk nl pace, flat grade, can't hurry or go uphills or steps s sob   Cough: minimal /triggered by candles, cats, spring time weather  Sleeping: on side bed flat/ 2 pillows SABA use: rarely while on symb 160 2bid  02: none   Some hb/ throat burning controlled with otcs rec No change on respiratory medications Try wellbutrin 150 mg one half daily x 2 weeks, then one daily x 2 weeks, then one twice daily  If not doing well with wellbutrin see your primary doctor  For ? chantix    Virtual Visit via Telephone Note 05/07/2019  GOLD I/II on symb 160   I connected with Shelly Haynes on 05/07/19 at  by telephone and verified that I am speaking with the correct person using two identifiers.   I discussed the limitations, risks, security and privacy concerns of performing an evaluation and management service by telephone and the availability  of in person appointments. I also discussed with the patient that there may be a patient responsible charge related to this service. The patient expressed understanding and agreed to proceed.   History of Present Illness: Dyspnea:  Still MMRC =1  Cough: minimal in am / better p symbicort  Sleeping: better p vistaril  SABA use: 3 x weekly  02: none Throat burning resolved with prilosec 20 mg otc ac qd rec Try again:  Try wellbutrin 150 mg one half daily x 2 weeks, then one daily x 2 weeks, then one twice daily  If side effects develop that don't respond to vistaril then drop back to the previous dose  Keep working on stopping smoking Ok to continue prilosec 20 mg otc Take 30-60 min before first meal of the day  Please schedule a follow up visit in 3 months but call sooner if needed    Virtual Visit via Telephone Note  08/08/2019   I connected with Frazier Butt on 08/08/19 at  810 am  by telephone and verified that I am speaking with the correct person using two identifiers.   I discussed the limitations, risks, security and privacy concerns of performing an evaluation and management service by telephone and the availability of in person appointments. I also discussed with the patient that there may be a patient responsible charge related to this service. The patient expressed understanding and agreed to proceed.   History of Present Illness: GOLD II/ still smoking maint on symb 160 2bid  Dyspnea:  No change = MMRC1 = can walk nl pace, flat grade, can't hurry or go uphills or steps s sob   Cough: better p takes her symbicort / min am mucoid Sleeping: ok  SABA use: rarely  02: none   No obvious day to day or daytime variability or assoc excess/ purulent sputum or mucus plugs or hemoptysis or cp or chest tightness, subjective wheeze or overt sinus or hb symptoms.    Also denies any obvious fluctuation of symptoms with weather or environmental changes or other aggravating or alleviating factors except as outlined above.   Meds reviewed/ med reconciliation completed       Observations/Objective: slt hoarse, min congested sounding  cough    Assessment and Plan: See problem list for active a/p's   Follow Up Instructions: See avs for instructions unique to this ov which includes revised/ updated med list     I discussed the assessment and treatment plan with the patient. The patient was provided an opportunity to ask questions and all were answered. The patient agreed with the plan and demonstrated an understanding of the instructions.   The patient was advised to call back or seek an in-person evaluation if the symptoms worsen or if the condition fails to improve as anticipated.  I provided  25  minutes of non-face-to-face time during this encounter.   Christinia Gully, MD

## 2019-08-08 NOTE — Assessment & Plan Note (Signed)
Counseled re importance of smoking cessation but did not meet time criteria for separate billing     Each maintenance medication was reviewed in detail including most importantly the difference between maintenance and as needed and under what circumstances the prns are to be used.  Please see AVS for specific  Instructions which are unique to this visit and I personally typed out  which were reviewed in detail over the phone with the patient and a copy provided via MyChart   F/u q 72m

## 2019-08-08 NOTE — Assessment & Plan Note (Signed)
Active smoker Spirometry 03/23/2016  FEV1 2.09 (70%)  Ratio 67 p symb 80 x2 in am with 50% hfa > try symbicort 160 2bid   - alpha one AT screen 03/23/16 > MM/ 191   - PFT's  11/05/2018  FEV1 2.24  (79 % ) ratio 65  p 9 % improvement from saba p symb  prior to study with DLCO  70 % corrects to 65  % for alv volume   - 11/05/2018  After extensive coaching inhaler device,  effectiveness =    90%  Despite active smoking doing ok on symbicort 160 2bid so no change rx > f/u at 3 m

## 2019-08-09 ENCOUNTER — Ambulatory Visit: Payer: 59 | Admitting: Internal Medicine

## 2019-09-18 ENCOUNTER — Telehealth: Payer: Self-pay | Admitting: Internal Medicine

## 2019-09-18 ENCOUNTER — Other Ambulatory Visit: Payer: Self-pay | Admitting: Internal Medicine

## 2019-09-18 DIAGNOSIS — J449 Chronic obstructive pulmonary disease, unspecified: Secondary | ICD-10-CM

## 2019-09-18 MED ORDER — ALBUTEROL SULFATE HFA 108 (90 BASE) MCG/ACT IN AERS: INHALATION_SPRAY | RESPIRATORY_TRACT | 2 refills | Status: DC

## 2019-09-18 MED ORDER — BUDESONIDE-FORMOTEROL FUMARATE 160-4.5 MCG/ACT IN AERO: 2.0000 | INHALATION_SPRAY | Freq: Two times a day (BID) | RESPIRATORY_TRACT | 2 refills | Status: DC

## 2019-09-18 NOTE — Telephone Encounter (Signed)
Spoke with pt and advised rx sent to pharmacy. Nothing further is needed.   

## 2019-09-19 ENCOUNTER — Telehealth: Payer: Self-pay | Admitting: Internal Medicine

## 2019-09-19 NOTE — Telephone Encounter (Signed)
Spoke with the pt  She states that she has never had to pay for her symbicort before, but now her copay has changed to $50  I offered to leave her coupon to pick up but she wanted it mailed bc she lives in [place] was mailed  Nothing further needed  She will call if has any further issues

## 2019-10-09 ENCOUNTER — Telehealth: Payer: Self-pay | Admitting: Internal Medicine

## 2019-10-09 MED ORDER — ALBUTEROL SULFATE HFA 108 (90 BASE) MCG/ACT IN AERS: 2.0000 | INHALATION_SPRAY | RESPIRATORY_TRACT | 2 refills | Status: DC | PRN

## 2019-10-09 NOTE — Telephone Encounter (Signed)
Called [facility] in [place] and spoke with [facility] - she is unable to tell what albuterol inhaler is covered in place of the Proair.  Will try Ventolin HFA.    Called spoke with patient who reported that her Albuterol inhaler had been a $10 copay; is now up to $50 this past month.  She would like a different brand.  Advised will try Ventolin.  Patient okay with this and voiced understanding.  She will call if this is too high as well.  Rx sent Nothing further needed; will sign off.

## 2019-10-09 NOTE — Telephone Encounter (Signed)
Patient is returning the call. CB is [phone]

## 2019-10-09 NOTE — Telephone Encounter (Signed)
Attempted to call pt but unable to reach. Left message for pt to return call. 

## 2019-12-08 ENCOUNTER — Other Ambulatory Visit: Payer: Self-pay | Admitting: Internal Medicine

## 2019-12-08 DIAGNOSIS — J449 Chronic obstructive pulmonary disease, unspecified: Secondary | ICD-10-CM

## 2020-01-05 ENCOUNTER — Other Ambulatory Visit: Payer: Self-pay | Admitting: Internal Medicine

## 2020-01-05 DIAGNOSIS — J449 Chronic obstructive pulmonary disease, unspecified: Secondary | ICD-10-CM

## 2020-01-12 ENCOUNTER — Other Ambulatory Visit: Payer: Self-pay | Admitting: Internal Medicine

## 2020-01-12 DIAGNOSIS — J449 Chronic obstructive pulmonary disease, unspecified: Secondary | ICD-10-CM

## 2020-01-13 ENCOUNTER — Telehealth: Payer: Self-pay | Admitting: Internal Medicine

## 2020-01-13 DIAGNOSIS — J449 Chronic obstructive pulmonary disease, unspecified: Secondary | ICD-10-CM

## 2020-01-13 MED ORDER — BUDESONIDE-FORMOTEROL FUMARATE 160-4.5 MCG/ACT IN AERO: INHALATION_SPRAY | RESPIRATORY_TRACT | 3 refills | Status: DC

## 2020-01-13 NOTE — Telephone Encounter (Signed)
Spoke with pt and advised that refill for Symbicort was sent to pharmacy. PT verbalized understanding. Nothing further needed.

## 2020-02-18 ENCOUNTER — Telehealth: Payer: Self-pay | Admitting: Internal Medicine

## 2020-02-18 MED ORDER — AZITHROMYCIN 250 MG PO TABS: ORAL_TABLET | ORAL | 0 refills | Status: AC

## 2020-02-18 NOTE — Telephone Encounter (Signed)
Called and spoke with pt letting her know that abx was sent to pharmacy for her. Pt verbalized understanding. Nothing further needed.

## 2020-02-18 NOTE — Telephone Encounter (Signed)
Sent in zpack. Continue Allergra. Follow up if symptoms do not improve

## 2020-02-18 NOTE — Telephone Encounter (Signed)
Called and spoke with pt who stated she has had problems with her allergies x1 week. Stated she worked outside over the weekend and due to that, pt started having issues. Pt feels like she has a sinus infection. Pt started taking allegra to see if that would help but pt stated she has had postnasal drainage as well as coughing up yellow phlegm. Pt states she has been running a temp at night and stated overnight temp was 99.7.  Pt is taking all meds as prescribed.  Pt did get covid tested today at [facility] as she was sent there by [employer] where she works.  Pt is requesting to have a zpak called in to see if it would help with her symptoms. Beth, please advise.

## 2020-03-28 ENCOUNTER — Other Ambulatory Visit: Payer: Self-pay | Admitting: Internal Medicine

## 2020-03-28 DIAGNOSIS — J449 Chronic obstructive pulmonary disease, unspecified: Secondary | ICD-10-CM

## 2020-06-17 ENCOUNTER — Telehealth: Payer: Self-pay | Admitting: Internal Medicine

## 2020-06-17 DIAGNOSIS — J449 Chronic obstructive pulmonary disease, unspecified: Secondary | ICD-10-CM

## 2020-06-17 MED ORDER — SYMBICORT 160-4.5 MCG/ACT IN AERO: INHALATION_SPRAY | RESPIRATORY_TRACT | 0 refills | Status: DC

## 2020-06-17 NOTE — Telephone Encounter (Signed)
Rx for Symbicort has been sent to preferred pharmacy for pt. Called and spoke with pt letting her know this had been done and she verbalized understanding. Stated to pt that she needed to schedule a f/u so appt has been scheduled. Stated to her that she needed to keep f/u in order to still receive med refills and she verbalized understanding. Nothing further needed.

## 2020-07-06 ENCOUNTER — Telehealth: Payer: Self-pay | Admitting: Internal Medicine

## 2020-07-06 NOTE — Telephone Encounter (Signed)
Changed, called let patient know. They were told to quarantine due to possible exposure.

## 2020-07-09 ENCOUNTER — Encounter: Payer: Self-pay | Admitting: Internal Medicine

## 2020-07-09 ENCOUNTER — Ambulatory Visit (INDEPENDENT_AMBULATORY_CARE_PROVIDER_SITE_OTHER): Payer: 59 | Admitting: Internal Medicine

## 2020-07-09 ENCOUNTER — Other Ambulatory Visit: Payer: Self-pay

## 2020-07-09 DIAGNOSIS — F17210 Nicotine dependence, cigarettes, uncomplicated: Secondary | ICD-10-CM

## 2020-07-09 DIAGNOSIS — J449 Chronic obstructive pulmonary disease, unspecified: Secondary | ICD-10-CM

## 2020-07-09 MED ORDER — SYMBICORT 160-4.5 MCG/ACT IN AERO: INHALATION_SPRAY | RESPIRATORY_TRACT | 3 refills | Status: DC

## 2020-07-09 MED ORDER — ALBUTEROL SULFATE HFA 108 (90 BASE) MCG/ACT IN AERS: 2.0000 | INHALATION_SPRAY | RESPIRATORY_TRACT | 2 refills | Status: DC | PRN

## 2020-07-09 NOTE — Assessment & Plan Note (Signed)
Active smoker Spirometry 03/23/2016  FEV1 2.09 (70%)  Ratio 67 p symb 80 x2 in am with 50% hfa > try symbicort 160 2bid   - alpha one AT screen 03/23/16 > MM/ 191   - PFT's  11/05/2018  FEV1 2.24  (79 % ) ratio 65  p 9 % improvement from saba p symb  prior to study with DLCO  70 % corrects to 65  % for alv volume   - 11/05/2018  After extensive coaching inhaler device,  effectiveness =    90%  Despite smoking her symptoms and use of saba reasonable so no change rx needed and f/u can be yearly in [place] or [place]

## 2020-07-09 NOTE — Patient Instructions (Addendum)
Suggest  e-cigs as an optional  "one way bridge"  Off all tobacco products    Please schedule a follow up visit in 12 months but call sooner if needed   - [phone]

## 2020-07-09 NOTE — Progress Notes (Signed)
Subjective:    Patient ID: Shelly Haynes, female    DOB: 09/20/1971,    MRN: [id number]     Brief patient profile:  48 yowf active smoker/ ekg monitor at [facility] asthma as child severe enough to require ER visits but out grew age 49 and  Fine including sports in HS but started smoking age 26 and around early 2007 dx as copd by [facility] in [place] rx advair which helped and then changed over to symbicort 80 and not doing as well so self referred to pulmonary clinic 03/23/2016     History of Present Illness  03/23/2016 1st [facility] office visit/ Ceili Boshers   Chief Complaint  Patient presents with  . Pulmonary Consult    Self referral. Pt states she was dxed with COPD approx 10 yrs ago. She states she has also been dxed with allergies and asthma. She c/o when she walks briskly or runs.   feels tight in chest in am better p symbicort  MMRC1 = can walk nl pace, flat grade, can't hurry or go uphills or steps s sob   Worse with uri's / worse with exp dog or cat  rec Plan A = Automatic = Symbicort 160 Take 2 puffs first thing in am and then another 2 puffs about 12 hours later.  Work on inhaler technique:   Plan B = Backup Only use your albuterol as a rescue medication  The key is to stop smoking completely before smoking completely stops you!    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 Acute Visit    She states having elevated BP and vision changes for the past 6 months- PCP told her that the symbicort may need to be changed. She states overall her breathing is doing well. She states that she has "allergies"- itchy eyes.   doe = MMRC1 = can walk nl pace, flat grade, can't hurry or go uphills or steps s sob   still smoking and using vapes bp never higher than 160/90 even when stressed  Sleeping fine flat Min am cough / mucoid      11/05/2018  f/u ov/Benjiman Sedgwick re: GOLD I/II  maint on symbicort 160  Chief  Complaint  Patient presents with  . Follow-up    PFT's done 10/30/18. She c/o anxiety recently that she relates to her breathing. She states occ has some burning in her throat. She rarely uses her albuterol inhaler.   Dyspnea:  MMRC1 = can walk nl pace, flat grade, can't hurry or go uphills or steps s sob   Cough: minimal /triggered by candles, cats, spring time weather  Sleeping: on side bed flat/ 2 pillows SABA use: rarely while on symb 160 2bid  02: none   Some hb/ throat burning controlled with otcs rec No change on respiratory medications Try wellbutrin 150 mg one half daily x 2 weeks, then one daily x 2 weeks, then one twice daily  If not doing well with wellbutrin see your primary doctor  For ? chantix    Virtual Visit via Telephone Note 05/07/2019  GOLD I/II on symb 160   I connected with Shelly Haynes on 05/07/19 at  by telephone and verified that I am speaking with the correct person using two identifiers.   I discussed the limitations, risks, security and privacy concerns of performing an evaluation and management service by telephone and the availability  of in person appointments. I also discussed with the patient that there may be a patient responsible charge related to this service. The patient expressed understanding and agreed to proceed.   History of Present Illness: Dyspnea:  Still MMRC =1  Cough: minimal in am / better p symbicort  Sleeping: better p vistaril  SABA use: 3 x weekly  02: none Throat burning resolved with prilosec 20 mg otc ac qd rec Try again:  Try wellbutrin 150 mg one half daily x 2 weeks, then one daily x 2 weeks, then one twice daily  If side effects develop that don't respond to vistaril then drop back to the previous dose  Keep working on stopping smoking Ok to continue prilosec 20 mg otc Take 30-60 min before first meal of the day  Please schedule a follow up visit in 3 months but call sooner if needed    Virtual Visit via Telephone Note  08/08/2019   I connected with Shelly Haynes on 08/08/19 at  810 am  by telephone and verified that I am speaking with the correct person using two identifiers.   I discussed the limitations, risks, security and privacy concerns of performing an evaluation and management service by telephone and the availability of in person appointments. I also discussed with the patient that there may be a patient responsible charge related to this service. The patient expressed understanding and agreed to proceed.   History of Present Illness: GOLD II/ still smoking maint on symb 160 2bid  Dyspnea:  No change = MMRC1 = can walk nl pace, flat grade, can't hurry or go uphills or steps s sob   Cough: better p takes her symbicort / min am mucoid Sleeping: ok  SABA use: rarely  rec The key is to stop smoking completely before smoking completely stops you!  Plan A = Automatic = Always=    symbicort 160 Take 2 puffs first thing in am and then another 2 puffs about 12 hours later.  Plan B = Backup (to supplement plan A, not to replace it) Only use your albuterol inhaler as a rescue medication   Virtual Visit via Telephone Note 07/09/2020   I connected with Shelly Haynes on 07/09/20 at  8:25  EDT by telephone and verified that I am speaking with the correct person using two identifiers. Pt is at home and this call made from my office with no other participants    I discussed the limitations, risks, security and privacy concerns of performing an evaluation and management service by telephone and the availability of in person appointments. I also discussed with the patient that there may be a patient responsible charge related to this service. The patient expressed understanding and agreed to proceed.   History of Present Illness: GOLD II / still smoking / has used ecig/vaccinated but freq contacts with sick unvaccinated Dyspnea: MMRC1 = can walk nl pace, flat grade, can't hurry or go uphills or steps s sob   Cough: some in  am / recent sinus infection all cleared  Sleeping: almost flat with one pillow SABA use: uses more since masking at work but only a few times a week  02: none    No obvious day to day or daytime variability or assoc excess/ purulent sputum or mucus plugs or hemoptysis or cp or chest tightness, subjective wheeze or overt sinus or hb symptoms.    Also denies any obvious fluctuation of symptoms with weather or environmental changes or other aggravating or  alleviating factors except as outlined above.   Meds reviewed/ med reconciliation completed     No outpatient medications have been marked as taking for the 07/09/20 encounter (Office Visit) with Nyoka Cowden, MD.         Observations/Objective: Good voice texture / dry sounding cough on voluntary s rattling/ no conversational sob    Assessment and Plan: See problem list for active a/p's   Follow Up Instructions: See avs for instructions unique to this ov which includes revised/ updated med list     I discussed the assessment and treatment plan with the patient. The patient was provided an opportunity to ask questions and all were answered. The patient agreed with the plan and demonstrated an understanding of the instructions.   The patient was advised to call back or seek an in-person evaluation if the symptoms worsen or if the condition fails to improve as anticipated.  I provided 22 minutes of non-face-to-face time during this encounter.   Sandrea Hughs, MD

## 2020-07-09 NOTE — Assessment & Plan Note (Signed)
Counseled re importance of smoking cessation but did not meet time criteria for separate billing    Each maintenance medication was reviewed in detail including most importantly the difference between maintenance and as needed and under what circumstances the prns are to be used.  Please see AVS for specific  Instructions which are unique to this visit and I personally typed out  which were reviewed in detail over the phone with the patient and a copy provided via mail but pt encouraged to sign up for MyChart

## 2020-10-13 ENCOUNTER — Other Ambulatory Visit: Payer: Self-pay | Admitting: Internal Medicine

## 2020-10-13 MED ORDER — ALBUTEROL SULFATE HFA 108 (90 BASE) MCG/ACT IN AERS: 2.0000 | INHALATION_SPRAY | RESPIRATORY_TRACT | 2 refills | Status: AC | PRN

## 2021-06-02 ENCOUNTER — Other Ambulatory Visit: Payer: Self-pay

## 2021-06-02 DIAGNOSIS — J449 Chronic obstructive pulmonary disease, unspecified: Secondary | ICD-10-CM

## 2021-06-02 MED ORDER — SYMBICORT 160-4.5 MCG/ACT IN AERO: INHALATION_SPRAY | RESPIRATORY_TRACT | 0 refills | Status: AC
